# Patient Record
Sex: Male | Born: 1980 | Race: Black or African American | Hispanic: No | Marital: Single | Smoking: Never smoker
Health system: Southern US, Community
[De-identification: ages and names within clinical notes are randomized; demographics above are authoritative.]

---

## 2016-04-26 ENCOUNTER — Emergency Department (HOSPITAL_COMMUNITY)
Admission: EM | Admit: 2016-04-26 | Discharge: 2016-04-27 | Disposition: A | Discharge status: Home / Self Care | Payer: Self-pay | Attending: Emergency Medicine | Admitting: Emergency Medicine

## 2016-04-26 ENCOUNTER — Encounter (HOSPITAL_COMMUNITY): Payer: Self-pay | Admitting: Nurse Practitioner

## 2016-04-26 DIAGNOSIS — Z79899 Other long term (current) drug therapy: Secondary | ICD-10-CM | POA: Insufficient documentation

## 2016-04-26 DIAGNOSIS — B354 Tinea corporis: Secondary | ICD-10-CM | POA: Insufficient documentation

## 2016-04-26 LAB — CBC WITH DIFFERENTIAL/PLATELET
BASOS PCT: 0 %
Basophils Absolute: 0 10*3/uL (ref 0.0–0.1)
Eosinophils Absolute: 0.4 10*3/uL (ref 0.0–0.7)
Eosinophils Relative: 5 %
HEMATOCRIT: 41 % (ref 39.0–52.0)
HEMOGLOBIN: 14.1 g/dL (ref 13.0–17.0)
LYMPHS ABS: 1.9 10*3/uL (ref 0.7–4.0)
LYMPHS PCT: 22 %
MCH: 29.3 pg (ref 26.0–34.0)
MCHC: 34.4 g/dL (ref 30.0–36.0)
MCV: 85.2 fL (ref 78.0–100.0)
MONO ABS: 0.7 10*3/uL (ref 0.1–1.0)
MONOS PCT: 9 %
NEUTROS ABS: 5.5 10*3/uL (ref 1.7–7.7)
NEUTROS PCT: 64 %
Platelets: 309 10*3/uL (ref 150–400)
RBC: 4.81 MIL/uL (ref 4.22–5.81)
RDW: 12.9 % (ref 11.5–15.5)
WBC: 8.5 10*3/uL (ref 4.0–10.5)

## 2016-04-26 NOTE — ED Triage Notes (Signed)
Pt presents with right foot swelling consistent with cellulitis, onset he states about a month ago when he went camping, states the swelling, which appears typical of an infection started spreading to the superior aspect of his leg. Denies fevers or chills or symptoms to suggest systemic involvement.

## 2016-04-26 NOTE — ED Provider Notes (Signed)
[facility]-EMERGENCY DEPT Provider Note   CSN: [id number] Arrival date & time: 04/26/16  2200 By signing my name below, I, Chad Gallagher, attest that this documentation has been prepared under the direction and in the presence of Chad AlbeIva Kanan Sobek, MD. Electronically Signed: Bridgette HabermannMaria Gallagher, ED Scribe. 04/26/16. 11:25 PM.  Time seen 23:17 PM  History   Chief Complaint Chief Complaint  Patient presents with  . Foot Swelling    Right   HPI Comments: Chad Gallagher is a 35 y.o. male with no pertinent PMHx, who presents to the Emergency Department complaining of swelling to the right foot radiating to his right ankle onset 3 days ago. Pt states he noticed a pruritic "bump" to his right foot a month ago while he was camping which has since spread to the superior aspect of his right ankle; he notes this may be due to him scratching the area excessively 2.5 weeks ago. He states there is often a clear or faint yellow drainage coming from the area on the area on his foot.  He has not tried any OTC medications PTA. Pt denies h/o similar symptoms but notes he had eczema as a child. He does not have any pets. He does not go to the gym. Pt does not have a PCP he regularly follows up with. Pt is not a smoker and does not drink alcohol. Pt denies fever, chills, or any other associated symptoms.  The history is provided by the patient. No language interpreter was used.   PCP none  History reviewed. No pertinent past medical history.  There are no active problems to display for this patient.   History reviewed. No pertinent surgical history.     Home Medications    Prior to Admission medications   Medication Sig Start Date End Date Taking? Authorizing Provider  Ascorbic Acid (VITAMIN C PO) Take 1 tablet by mouth daily.   Yes Historical Provider, MD  OIL OF OREGANO PO Take 1 capsule by mouth 2 (two) times daily.   Yes Historical Provider, MD  TURMERIC PO Take 1 capsule by mouth daily as needed (pain.).   Yes  Historical Provider, MD  sulfamethoxazole-trimethoprim (BACTRIM DS,SEPTRA DS) 800-160 MG tablet Take 1 tablet by mouth 2 (two) times daily. 04/27/16   Chad AlbeIva Thamara Leger, MD  terbinafine (LAMISIL) 250 MG tablet Take 1 tablet (250 mg total) by mouth daily. 04/27/16   Chad AlbeIva Reda Gettis, MD    Family History History reviewed. No pertinent family history.  Social History Social History  Substance Use Topics  . Smoking status: Never Smoker  . Smokeless tobacco: Never Used  . Alcohol use Yes  Pt is a singer and travels for work   Allergies   Penicillins   Review of Systems Review of Systems  Constitutional: Negative for chills and fever.  Skin: Positive for color change and rash.  All other systems reviewed and are negative.    Physical Exam Updated Vital Signs BP 120/88 (BP Location: Left Arm)   Pulse 60   Temp 98 F (36.7 C) (Oral)   Resp 19   SpO2 98%   Physical Exam  Constitutional: He is oriented to person, place, and time. He appears well-developed and well-nourished.  Non-toxic appearance. He does not appear ill. No distress.  HENT:  Head: Normocephalic and atraumatic.  Right Ear: External ear normal.  Left Ear: External ear normal.  Nose: Nose normal. No mucosal edema or rhinorrhea.  Mouth/Throat: No dental abscesses or uvula swelling.  Eyes: Conjunctivae are normal.  Wearing a patch over his left eye  Neck: Normal range of motion and full passive range of motion without pain.  Cardiovascular: Normal rate.   Pulmonary/Chest: Effort normal. No respiratory distress.  Musculoskeletal: Normal range of motion. He exhibits edema. He exhibits no tenderness.  Moves all extremities well.  Neurological: He is alert and oriented to person, place, and time. No cranial nerve deficit.  Skin: Skin is warm and dry. No rash noted. There is erythema. No pallor.  Diffuse swelling of dorsum of right foot, ankle, and lower leg with a large, round area of the dorsum of his right foot with a  definitive border and scaly edge and central clearing. Multiple, round lesions of his RLE consistent with fungal infection.  Psychiatric: He has a normal mood and affect. His behavior is normal. His mood appears not anxious.  Nursing note and vitals reviewed.         ED Treatments / Results  DIAGNOSTIC STUDIES: Oxygen Saturation is 99% on RA, normal by my interpretation.     Labs (all labs ordered are listed, but only abnormal results are displayed)  Results for orders placed or performed during the hospital encounter of 04/26/16  Comprehensive metabolic panel  Result Value Ref Range   Sodium 136 135 - 145 mmol/L   Potassium 4.0 3.5 - 5.1 mmol/L   Chloride 102 101 - 111 mmol/L   CO2 25 22 - 32 mmol/L   Glucose, Bld 97 65 - 99 mg/dL   BUN 11 6 - 20 mg/dL   Creatinine, Ser 1.61 0.61 - 1.24 mg/dL   Calcium 9.3 8.9 - 09.6 mg/dL   Total Protein 7.8 6.5 - 8.1 g/dL   Albumin 4.1 3.5 - 5.0 g/dL   AST 22 15 - 41 U/L   ALT 14 (L) 17 - 63 U/L   Alkaline Phosphatase 115 38 - 126 U/L   Total Bilirubin 0.8 0.3 - 1.2 mg/dL   GFR calc non Af Amer >60 >60 mL/min   GFR calc Af Amer >60 >60 mL/min   Anion gap 9 5 - 15  CBC with Differential  Result Value Ref Range   WBC 8.5 4.0 - 10.5 K/uL   RBC 4.81 4.22 - 5.81 MIL/uL   Hemoglobin 14.1 13.0 - 17.0 g/dL   HCT 04.5 40.9 - 81.1 %   MCV 85.2 78.0 - 100.0 fL   MCH 29.3 26.0 - 34.0 pg   MCHC 34.4 30.0 - 36.0 g/dL   RDW 91.4 78.2 - 95.6 %   Platelets 309 150 - 400 K/uL   Neutrophils Relative % 64 %   Neutro Abs 5.5 1.7 - 7.7 K/uL   Lymphocytes Relative 22 %   Lymphs Abs 1.9 0.7 - 4.0 K/uL   Monocytes Relative 9 %   Monocytes Absolute 0.7 0.1 - 1.0 K/uL   Eosinophils Relative 5 %   Eosinophils Absolute 0.4 0.0 - 0.7 K/uL   Basophils Relative 0 %   Basophils Absolute 0.0 0.0 - 0.1 K/uL   Laboratory interpretation all normal    Procedures Procedures (including critical care time)  Medications Ordered in ED Medications - No  data to display   Initial Impression / Assessment and Plan / ED Course  I have reviewed the triage vital signs and the nursing notes.  Pertinent labs & imaging results that were available during my care of the patient were reviewed by me and considered in my medical decision making (see chart for details).  Clinical Course  COORDINATION OF CARE: 11:25 PM Discussed treatment plan with pt at bedside which includes blood work and pt agreed to plan.Due to the extensive multiple areas that will need treatment,  topical treatment will be difficult. Laboratory testing was done to make sure he had normal kidney and liver functions for probable oral antifungal treatment.   12:34 AM Discussed lab work with pt which was normal. Pt will be started on an anti-fungal and antibiotic to avoid secondary infection. Discussed treatment time, plan, and possible follow-up with pt which he agreed to. We discussed the treatment will take at least a month for the lesions to go away however he should be rechecked if he gets worse such as fever, chills, drainage of pus, worsening pain or swelling. He was advised he could continue to have some serosanguineous drainage and that is normal.  Final Clinical Impressions(s) / ED Diagnoses   Final diagnoses:  Tinea corporis    New Prescriptions Discharge Medication List as of 04/27/2016 12:54 AM    START taking these medications   Details  sulfamethoxazole-trimethoprim (BACTRIM DS,SEPTRA DS) 800-160 MG tablet Take 1 tablet by mouth 2 (two) times daily., Starting Thu 04/27/2016, Print    terbinafine (LAMISIL) 250 MG tablet Take 1 tablet (250 mg total) by mouth daily., Starting Thu 04/27/2016, Print        Plan discharge  Chad AlbeIva Corneisha Alvi, MD, FACEP  I personally performed the services described in this documentation, which was scribed in my presence. The recorded information has been reviewed and considered.  Chad AlbeIva Kahne Helfand, MD, Concha PyoFACEP     Kirin Brandenburger, MD 04/27/16  [phone]

## 2016-04-27 LAB — COMPREHENSIVE METABOLIC PANEL
ALK PHOS: 115 U/L (ref 38–126)
ALT: 14 U/L — ABNORMAL LOW (ref 17–63)
ANION GAP: 9 (ref 5–15)
AST: 22 U/L (ref 15–41)
Albumin: 4.1 g/dL (ref 3.5–5.0)
BUN: 11 mg/dL (ref 6–20)
CALCIUM: 9.3 mg/dL (ref 8.9–10.3)
CHLORIDE: 102 mmol/L (ref 101–111)
CO2: 25 mmol/L (ref 22–32)
Creatinine, Ser: 1.04 mg/dL (ref 0.61–1.24)
Glucose, Bld: 97 mg/dL (ref 65–99)
Potassium: 4 mmol/L (ref 3.5–5.1)
SODIUM: 136 mmol/L (ref 135–145)
Total Bilirubin: 0.8 mg/dL (ref 0.3–1.2)
Total Protein: 7.8 g/dL (ref 6.5–8.1)

## 2016-04-27 MED ORDER — TERBINAFINE HCL 250 MG PO TABS: 250.0000 mg | ORAL_TABLET | Freq: Every day | ORAL | 0 refills | Status: AC

## 2016-04-27 MED ORDER — SULFAMETHOXAZOLE-TRIMETHOPRIM 800-160 MG PO TABS: 1.0000 | ORAL_TABLET | Freq: Two times a day (BID) | ORAL | 0 refills | Status: AC

## 2016-04-27 NOTE — ED Notes (Signed)
Patient d/c'd self care.  F/U and medications reviewed.  Patient verbalized understanding. 

## 2016-04-27 NOTE — Discharge Instructions (Signed)
Try to not scratch the areas. Please take the antifungal medication, Terbinafine once a day for the next month. It can take several weeks for the lesions to totally go away. You can use OTC steroids for the itching. If you don't take the terbinafine, you can use OTC antifungal ointments daily for the next month like lotrimin, lamisil or miconazole. You can also use topical benadryl for itching or take oral benadryl for itching. Recheck if you get a fever, chills, diffuse redness and warmth to your leg,pain in your legs or drainage of pus.

## 2018-01-08 ENCOUNTER — Emergency Department (HOSPITAL_COMMUNITY): Payer: Self-pay

## 2018-01-08 ENCOUNTER — Emergency Department (HOSPITAL_COMMUNITY)
Admission: EM | Admit: 2018-01-08 | Discharge: 2018-01-08 | Disposition: A | Discharge status: Home / Self Care | Payer: Self-pay | Attending: Emergency Medicine | Admitting: Emergency Medicine

## 2018-01-08 ENCOUNTER — Other Ambulatory Visit: Payer: Self-pay

## 2018-01-08 ENCOUNTER — Encounter (HOSPITAL_COMMUNITY): Payer: Self-pay | Admitting: Emergency Medicine

## 2018-01-08 DIAGNOSIS — R2231 Localized swelling, mass and lump, right upper limb: Secondary | ICD-10-CM | POA: Insufficient documentation

## 2018-01-08 DIAGNOSIS — Y929 Unspecified place or not applicable: Secondary | ICD-10-CM | POA: Insufficient documentation

## 2018-01-08 DIAGNOSIS — W19XXXA Unspecified fall, initial encounter: Secondary | ICD-10-CM | POA: Insufficient documentation

## 2018-01-08 DIAGNOSIS — Z79899 Other long term (current) drug therapy: Secondary | ICD-10-CM | POA: Insufficient documentation

## 2018-01-08 DIAGNOSIS — S6291XA Unspecified fracture of right wrist and hand, initial encounter for closed fracture: Secondary | ICD-10-CM | POA: Insufficient documentation

## 2018-01-08 DIAGNOSIS — Y998 Other external cause status: Secondary | ICD-10-CM | POA: Insufficient documentation

## 2018-01-08 DIAGNOSIS — Y939 Activity, unspecified: Secondary | ICD-10-CM | POA: Insufficient documentation

## 2018-01-08 NOTE — ED Notes (Signed)
Patient transported to X-ray 

## 2018-01-08 NOTE — ED Triage Notes (Signed)
Pt. Stated, I broke my ? Hand and put this cast on and it s time for it to come off and I need for yall to take it off.

## 2018-01-08 NOTE — ED Provider Notes (Signed)
[facility] Provider Note   CSN: [id number] Arrival date & time: 01/08/18  [id number]     History   Chief Complaint Chief Complaint  Patient presents with  . Arm Pain    HPI Chad Gallagher is a 37 y.o. male.  37 y/o male with no PMH presents to the ED with a chief complaint of right arm swelling. Patient had a fall November 16, 2017 and dislocated his hand in [place]. Patient was placed in a cast and he has followed up with Orthopedics in [place] and states the cast was due to come off today. Patient does not have any Orthopedic follow up in [place]. He also reports some right arm swelling and limited range of motion. He denies any other complaints,pain out of proportion, chest pain or shortness of breath.     History reviewed. No pertinent past medical history.  There are no active problems to display for this patient.   History reviewed. No pertinent surgical history.      Home Medications    Prior to Admission medications   Medication Sig Start Date End Date Taking? Authorizing Provider  Ascorbic Acid (VITAMIN C PO) Take 1 tablet by mouth daily.    [provider]  OIL OF OREGANO PO Take 1 capsule by mouth 2 (two) times daily.    [provider]  sulfamethoxazole-trimethoprim (BACTRIM DS,SEPTRA DS) 800-160 MG tablet Take 1 tablet by mouth 2 (two) times daily. 04/27/16   Devoria AlbeKnapp, Iva, MD  terbinafine (LAMISIL) 250 MG tablet Take 1 tablet (250 mg total) by mouth daily. 04/27/16   Devoria AlbeKnapp, Iva, MD  TURMERIC PO Take 1 capsule by mouth daily as needed (pain.).    [provider]    Family History No family history on file.  Social History Social History   Tobacco Use  . Smoking status: Never Smoker  . Smokeless tobacco: Never Used  Substance Use Topics  . Alcohol use: Yes  . Drug use: No     Allergies   Penicillins   Review of Systems Review of Systems  Constitutional: Negative for chills and fever.    HENT: Negative for ear pain and sore throat.   Eyes: Negative for pain and visual disturbance.  Respiratory: Negative for cough and shortness of breath.   Cardiovascular: Negative for chest pain and palpitations.  Gastrointestinal: Negative for abdominal pain, diarrhea, nausea and vomiting.  Genitourinary: Negative for dysuria, flank pain and hematuria.  Musculoskeletal: Positive for arthralgias and myalgias. Negative for back pain and joint swelling.  Skin: Negative for color change and rash.  Neurological: Negative for seizures and syncope.  All other systems reviewed and are negative.    Physical Exam Updated Vital Signs BP 107/81 (BP Location: Left Arm)   Pulse 94   Temp 98.4 F (36.9 C) (Oral)   Resp 18   Ht 5\' 10"  (1.778 m)   Wt 79.4 kg (175 lb)   SpO2 99%   BMI 25.11 kg/m   Physical Exam  Musculoskeletal:       Right wrist: He exhibits swelling. He exhibits no tenderness, no bony tenderness and no effusion.  Capillary refill intact for all fingers on right hand.        ED Treatments / Results  Labs (all labs ordered are listed, but only abnormal results are displayed) Labs Reviewed - No data to display  EKG None  Radiology Dg Wrist Complete Right  Result Date: 01/08/2018 CLINICAL DATA:  Right wrist cast.  EXAM: RIGHT WRIST - COMPLETE 3+ VIEW COMPARISON:  No prior. FINDINGS: Patient's right wrist is casted. Bony evaluation is difficult. Distal radial fracture may be present. Bony alignment noted. IMPRESSION: Right wrist cast. Bony evaluation is difficult. Distal radial fracture may be present. Bony alignment noted. Electronically Signed   By: Maisie Fus  Register   On: 01/08/2018 13:32   Dg Hand Complete Right  Result Date: 01/08/2018 CLINICAL DATA:  Follow-up right hand fracture EXAM: RIGHT HAND - COMPLETE 3+ VIEW COMPARISON:  01/08/2018 FINDINGS: Overlying casting material limits bone detail. Oblique fracture of proximal shaft of the right fourth metacarpal with  persistent 2 mm ulnar displacement and without significant angulation. No significant callus formation. Soft tissues are normal. IMPRESSION: Overlying casting material limits bone detail. Oblique fracture of proximal shaft of the right fourth metacarpal with persistent 2 mm ulnar displacement and without significant angulation. Electronically Signed   By: Elige Ko   On: 01/08/2018 13:31    Procedures Procedures (including critical care time)  Medications Ordered in ED Medications - No data to display   Initial Impression / Assessment and Plan / ED Course  I have reviewed the triage vital signs and the nursing notes.  Pertinent labs & imaging results that were available during my care of the patient were reviewed by me and considered in my medical decision making (see chart for details).     Patient presents with right hand swelling. He was in an accident 8 weeks ago in [place] where he was seen by an orthopedist and placed on a cast.  He has follow-up with them through multiple appointments.  Patient is not from [place] and actually is a resident of [place], [place] he states he needed to have the cast removed today and he is had some pain to the right hand.DG wrist complete showed distal radius fracture may be present and a fracture of the proximal shaft of the right fourth metacarpal.  Placed a call to Dr. Merlyn Lot Orthopedist hand specialist for consult. Lyn RN called back, Dr. Merlyn Lot states the cast can be removed at this time but he needs to follow up with him outpatient tomorrow in office, patient to be placed on sugar tong splint for further fracture follow up. I will placed patient in sugar ton splint at this time after cast removal.I will provide him with referral for orthopedist follow up in [place] with Dr. Merlyn Lot.   Cast removed by Babs Bertin, patient able to wiggle fingers, pulses present.   Final Clinical Impressions(s) / ED Diagnoses   Final  diagnoses:  Closed fracture of right hand, initial encounter    ED Discharge Orders    None       Claude Manges, PA-C 01/08/18 1615    Tegeler, Canary Brim, MD 01/08/18 [phone]

## 2018-01-08 NOTE — ED Notes (Signed)
Declined W/C at D/C and was escorted to lobby by RN. 

## 2018-01-08 NOTE — Progress Notes (Signed)
Orthopedic Tech Progress Note Patient Details:  Chad DroneJonathan Mckain 01-03-1981 [id number]  Casting Type of Cast: Short arm cast Cast Intervention: Removal  Post Interventions Patient Tolerated: Well Instructions Provided: Care of device     Saul FordyceJennifer C Adileny Delon 01/08/2018, 4:16 PM

## 2018-01-08 NOTE — Progress Notes (Signed)
Orthopedic Tech Progress Note Patient Details:  Chad DroneJonathan Deshotel 24-Aug-1980 [id number]  Ortho Devices Type of Ortho Device: Short arm splint, Ace wrap Ortho Device/Splint Interventions: Application   Post Interventions Patient Tolerated: Well Instructions Provided: Care of device   Saul FordyceJennifer C Darleene Cumpian 01/08/2018, 4:15 PM

## 2018-01-08 NOTE — Discharge Instructions (Addendum)
Please return to the ED if you experience any of the following symptoms:  Your pain is getting worse. The injured area tingles, becomes numb, or turns cold and blue. The part of your body above or below the cast is swollen and discolored. You cannot feel or move your fingers or toes. There is fluid leaking through the cast. You have severe pain or pressure under the cast. You have trouble breathing. You have shortness of breath. You have chest pain.

## 2019-09-22 IMAGING — CR DG WRIST COMPLETE 3+V*R*
4 series · 4 of 4 positions shown · non-contrast
Comparison: No prior.

CLINICAL DATA: Right wrist cast.

EXAM:
RIGHT WRIST - COMPLETE 3+ VIEW

[wrist pa]
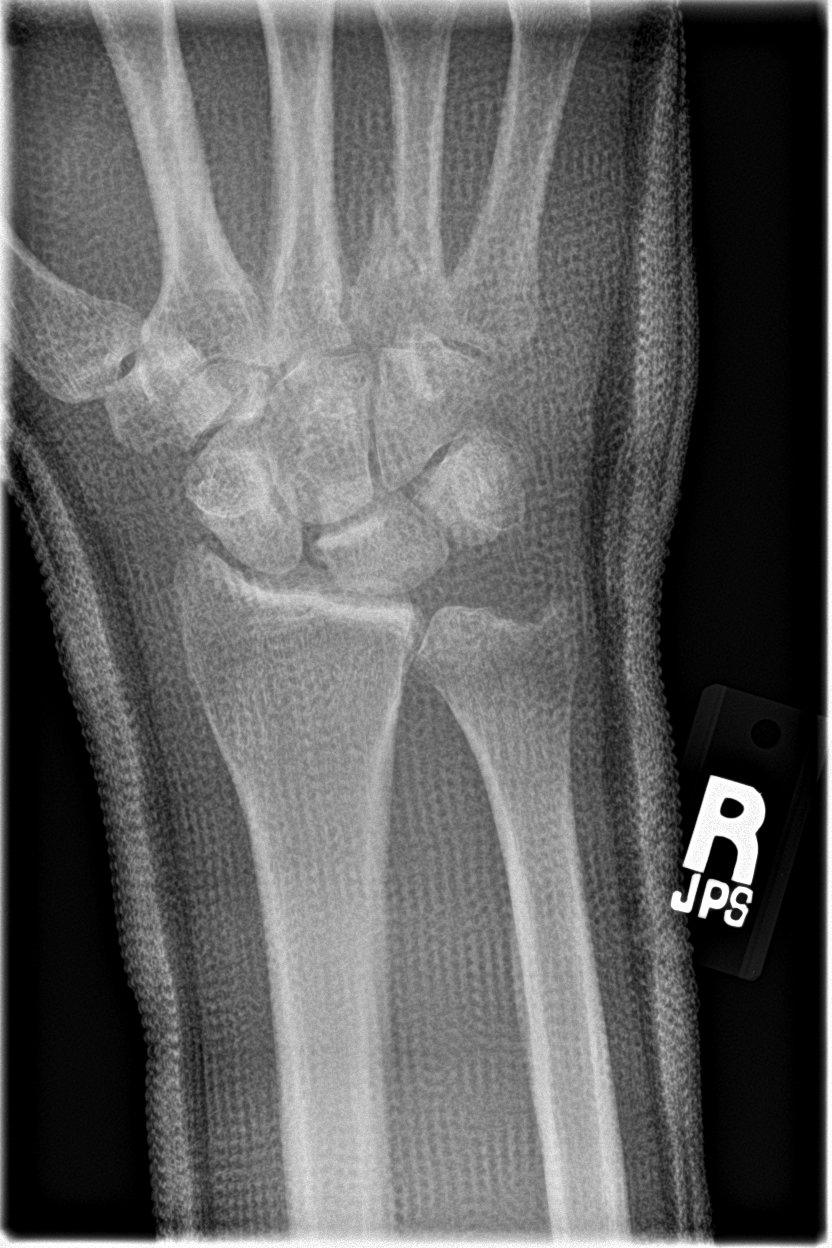

[wrist obl]
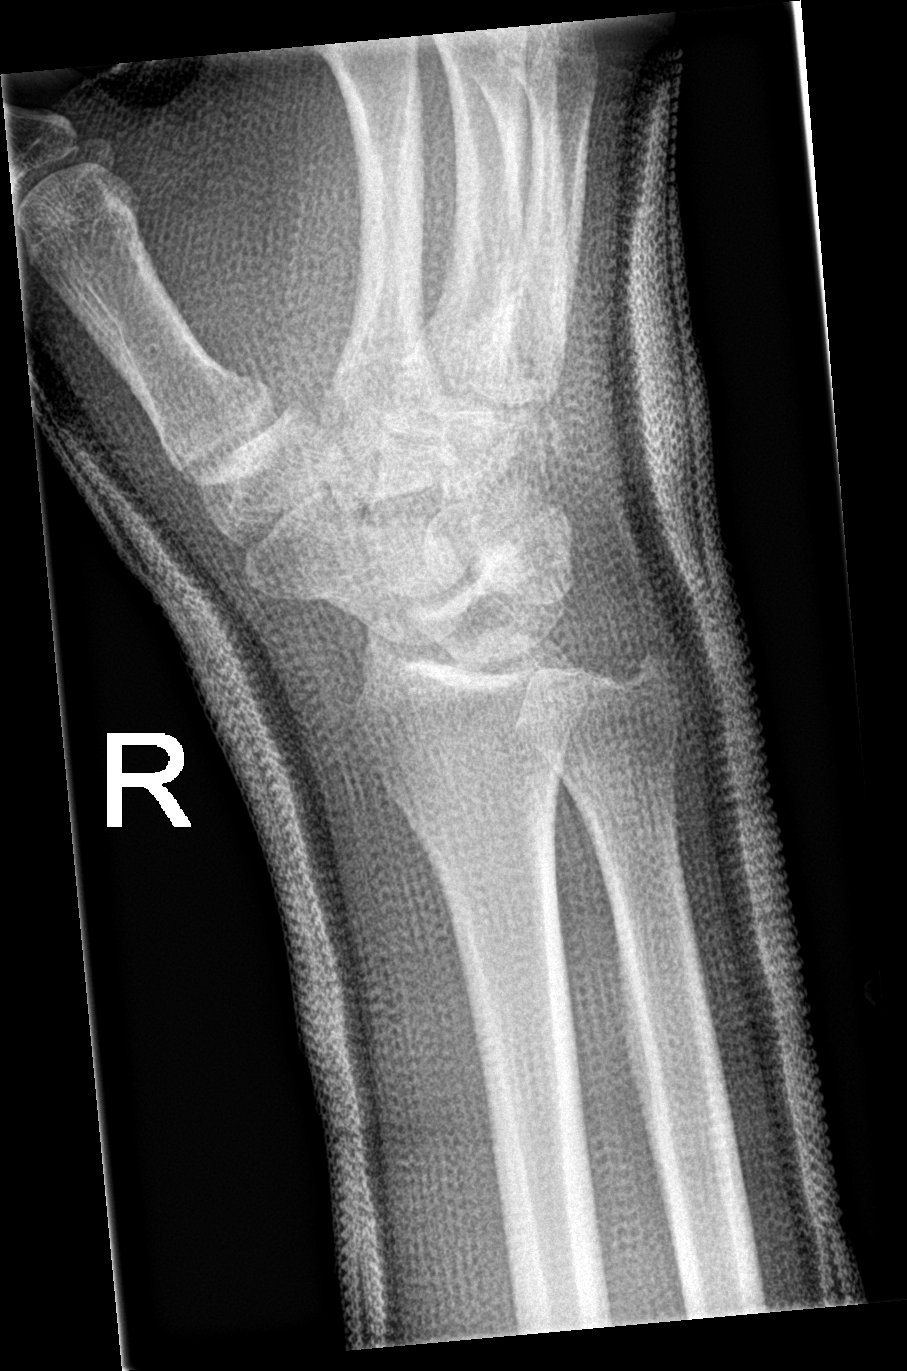

[wrist lat]
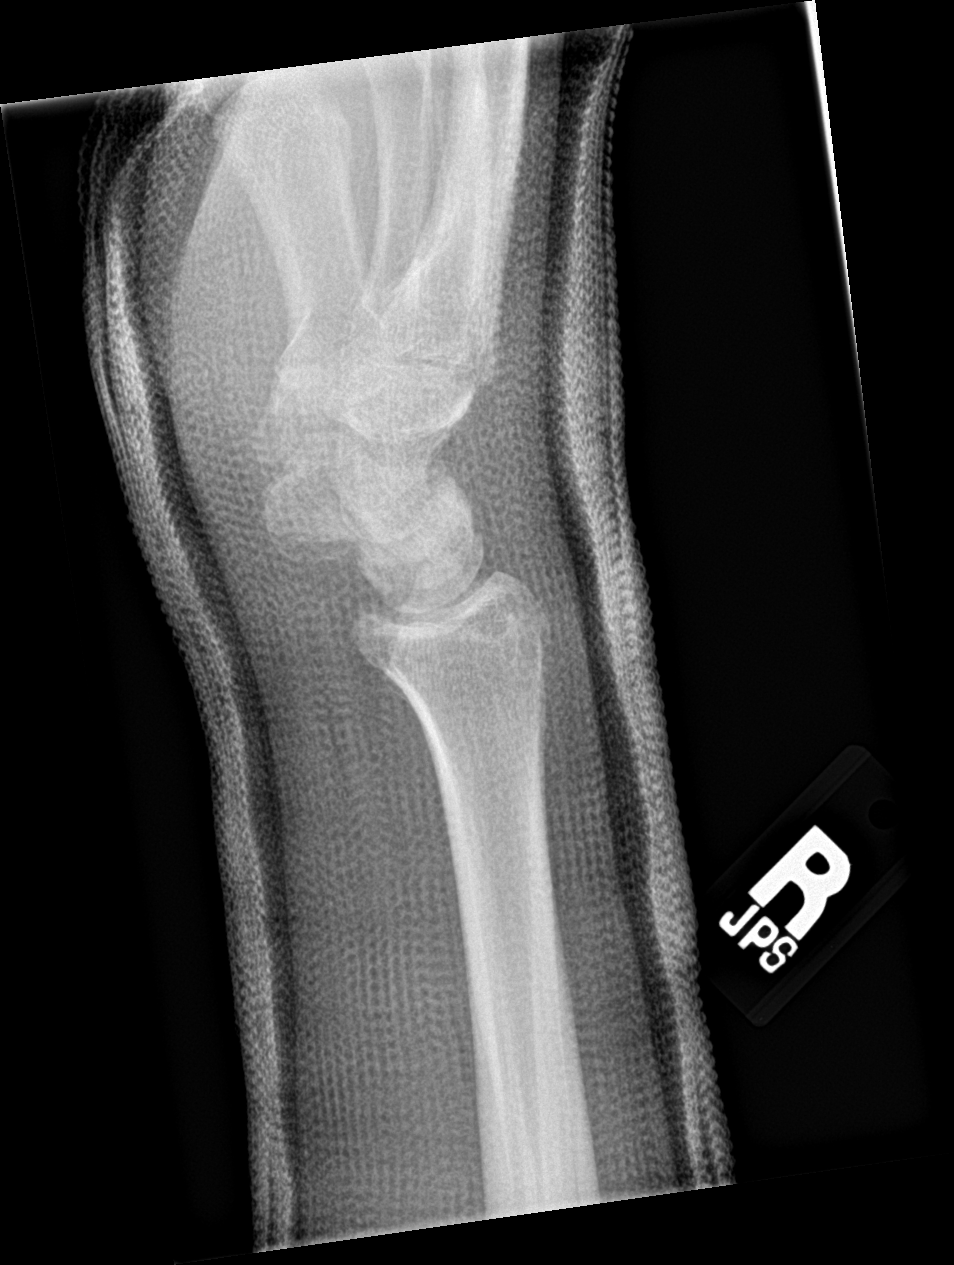

[wrist navicular]
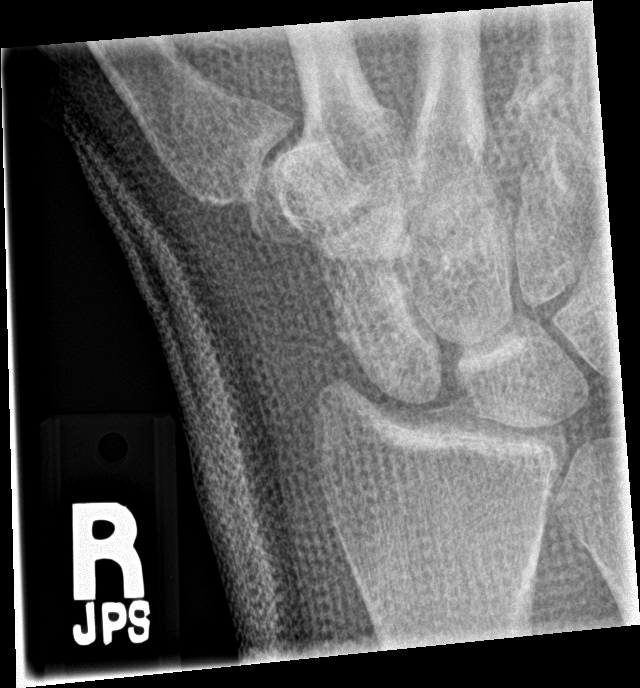

[4 of 4 positions shown; findings below may reference images not displayed]

FINDINGS: Patient's right wrist is casted. Bony evaluation is difficult.
Distal radial fracture may be present. Bony alignment noted.
IMPRESSION: Right wrist cast. Bony evaluation is difficult. Distal radial
fracture may be present. Bony alignment noted.
